# Patient Record
Sex: Male | Born: 1965 | Marital: Single | Smoking: Current every day smoker
Health system: Southern US, Community
[De-identification: ages and names within clinical notes are randomized; demographics above are authoritative.]

---

## 2013-10-07 ENCOUNTER — Emergency Department (INDEPENDENT_AMBULATORY_CARE_PROVIDER_SITE_OTHER)
Admission: EM | Admit: 2013-10-07 | Discharge: 2013-10-07 | Disposition: A | Discharge status: Home / Self Care | Payer: Medicaid Other | Source: Home / Self Care | Attending: Family Medicine | Admitting: Family Medicine

## 2013-10-07 ENCOUNTER — Encounter (HOSPITAL_COMMUNITY): Payer: Self-pay | Admitting: Emergency Medicine

## 2013-10-07 DIAGNOSIS — R030 Elevated blood-pressure reading, without diagnosis of hypertension: Secondary | ICD-10-CM

## 2013-10-07 DIAGNOSIS — IMO0001 Reserved for inherently not codable concepts without codable children: Secondary | ICD-10-CM

## 2013-10-07 LAB — POCT URINALYSIS DIP (DEVICE)
Bilirubin Urine: NEGATIVE
GLUCOSE, UA: NEGATIVE mg/dL
Hgb urine dipstick: NEGATIVE
Ketones, ur: NEGATIVE mg/dL
LEUKOCYTES UA: NEGATIVE
NITRITE: NEGATIVE
PROTEIN: NEGATIVE mg/dL
SPECIFIC GRAVITY, URINE: 1.02 (ref 1.005–1.030)
UROBILINOGEN UA: 0.2 mg/dL (ref 0.0–1.0)
pH: 6 (ref 5.0–8.0)

## 2013-10-07 LAB — POCT I-STAT, CHEM 8
BUN: 8 mg/dL (ref 6–23)
CALCIUM ION: 1.2 mmol/L (ref 1.12–1.23)
Chloride: 103 mEq/L (ref 96–112)
Creatinine, Ser: 1 mg/dL (ref 0.50–1.35)
GLUCOSE: 94 mg/dL (ref 70–99)
HCT: 49 % (ref 39.0–52.0)
HEMOGLOBIN: 16.7 g/dL (ref 13.0–17.0)
Potassium: 3.5 mEq/L — ABNORMAL LOW (ref 3.7–5.3)
Sodium: 143 mEq/L (ref 137–147)
TCO2: 28 mmol/L (ref 0–100)

## 2013-10-07 MED ORDER — AMLODIPINE BESYLATE 2.5 MG PO TABS: 2.5000 mg | ORAL_TABLET | Freq: Every day | ORAL | Status: DC

## 2013-10-07 NOTE — ED Notes (Signed)
C/o blood pressure elevation States he never has been on medication

## 2013-10-07 NOTE — ED Provider Notes (Signed)
CSN: [id number]     Arrival date & time 10/07/13  1329 History   First MD Initiated Contact with Patient 10/07/13 1423     Chief Complaint  Patient presents with  . Hypertension     (Consider location/radiation/quality/duration/timing/severity/associated sxs/prior Treatment) HPI Comments: Patient reports that 4 months ago while still living in [place], he was diagnosed with hypertension, but was not prescribed any treatment. He is a recent immigrant to the [place] and while with his caseworker this morning, he reported this history of HTN and stated that on occasion he feels dizzy and caseworker advised patient should be evaluated at our facility. At the time of exam, patient reports himself to be asymptomatic.  Denies chest pain, syncope, near syncope, dyspnea, pedal edema, palpitations, melena, polyuria, polydipsia or changes in speech, strength, sensation, headaches, or changes in  vision or balance.   Patient is a 48 y.o. male presenting with hypertension. The history is provided by the patient. The history is limited by a language barrier (speaks only Burmese). A language interpreter was used.  Hypertension    History reviewed. No pertinent past medical history. No past surgical history on file. No family history on file. History  Substance Use Topics  . Smoking status: Not on file  . Smokeless tobacco: Not on file  . Alcohol Use: Not on file    Review of Systems  All other systems reviewed and are negative.      Allergies  Review of patient's allergies indicates no known allergies.  Home Medications  No current outpatient prescriptions on file. BP 138/99  Pulse 73  Temp(Src) 98.8 F (37.1 C) (Oral)  Resp 16  SpO2 98% Physical Exam  Nursing note and vitals reviewed. Constitutional: He is oriented to person, place, and time. He appears well-developed and well-nourished. No distress.  HENT:  Head: Normocephalic and atraumatic.  Right Ear: External ear normal.  Left  Ear: External ear normal.  Nose: Nose normal.  Mouth/Throat: Oropharynx is clear and moist.  Eyes: Conjunctivae are normal. Right eye exhibits no discharge. Left eye exhibits no discharge. No scleral icterus.  Neck: Normal range of motion. Neck supple. No JVD present. No thyromegaly present.  Cardiovascular: Normal rate, regular rhythm and normal heart sounds.   Pulmonary/Chest: Effort normal and breath sounds normal.  Abdominal: Soft. Bowel sounds are normal. He exhibits no distension. There is no tenderness.  Musculoskeletal: Normal range of motion. He exhibits no edema and no tenderness.  Lymphadenopathy:    He has no cervical adenopathy.  Neurological: He is alert and oriented to person, place, and time. No cranial nerve deficit.  Skin: Skin is warm and dry. No rash noted. He is not diaphoretic. No erythema.  Psychiatric: He has a normal mood and affect. His behavior is normal.    ED Course  Procedures (including critical care time) Labs Review Labs Reviewed - No data to display Imaging Review No results found.  ECG: NSR @ 63 bpm without acute ST/Twave changes or ectopy  MDM   Final diagnoses:  None   UA unremarkable. I-stat grossly normal. No anemia. Will provide Rx for Norvasc 2.5mg  po QD in limited amount and resource information [facility]. Will advise patient follow up with above clinic.   Jess BartersJennifer Lee South[place], [place] 10/07/13 1550

## 2013-10-08 NOTE — ED Provider Notes (Signed)
Medical screening examination/treatment/procedure(s) were performed by resident physician or non-physician practitioner and as supervising physician I was immediately available for consultation/collaboration.   Jacie Tristan DOUGLAS MD.   Jourdin Gens D Christasia Angeletti, MD 10/08/13 1955 

## 2013-10-29 ENCOUNTER — Ambulatory Visit: Payer: Self-pay

## 2013-10-31 ENCOUNTER — Ambulatory Visit: Payer: Medicaid Other | Attending: Internal Medicine | Admitting: Internal Medicine

## 2013-10-31 ENCOUNTER — Encounter: Payer: Self-pay | Admitting: Internal Medicine

## 2013-10-31 VITALS — BP 146/96 | HR 66 | Temp 98.8°F | Resp 16 | Ht 66.0 in | Wt 142.0 lb

## 2013-10-31 DIAGNOSIS — Z0000 Encounter for general adult medical examination without abnormal findings: Secondary | ICD-10-CM

## 2013-10-31 DIAGNOSIS — T148XXA Other injury of unspecified body region, initial encounter: Secondary | ICD-10-CM

## 2013-10-31 DIAGNOSIS — I10 Essential (primary) hypertension: Secondary | ICD-10-CM

## 2013-10-31 DIAGNOSIS — B351 Tinea unguium: Secondary | ICD-10-CM

## 2013-10-31 LAB — CBC
HCT: 43.6 % (ref 39.0–52.0)
Hemoglobin: 15.3 g/dL (ref 13.0–17.0)
MCH: 32.1 pg (ref 26.0–34.0)
MCHC: 35.1 g/dL (ref 30.0–36.0)
MCV: 91.4 fL (ref 78.0–100.0)
PLATELETS: 203 10*3/uL (ref 150–400)
RBC: 4.77 MIL/uL (ref 4.22–5.81)
RDW: 12.5 % (ref 11.5–15.5)
WBC: 6.8 10*3/uL (ref 4.0–10.5)

## 2013-10-31 LAB — LIPID PANEL
CHOL/HDL RATIO: 3.7 ratio
CHOLESTEROL: 187 mg/dL (ref 0–200)
HDL: 51 mg/dL (ref >39)
LDL Cholesterol: 118 mg/dL — ABNORMAL HIGH (ref 0–99)
TRIGLYCERIDES: 92 mg/dL (ref <150)
VLDL: 18 mg/dL (ref 0–40)

## 2013-10-31 LAB — COMPLETE METABOLIC PANEL WITH GFR
ALT: 51 U/L (ref 0–53)
AST: 34 U/L (ref 0–37)
Albumin: 4.5 g/dL (ref 3.5–5.2)
Alkaline Phosphatase: 104 U/L (ref 39–117)
BILIRUBIN TOTAL: 0.4 mg/dL (ref 0.2–1.2)
BUN: 10 mg/dL (ref 6–23)
CO2: 28 mEq/L (ref 19–32)
CREATININE: 0.91 mg/dL (ref 0.50–1.35)
Calcium: 9.7 mg/dL (ref 8.4–10.5)
Chloride: 102 mEq/L (ref 96–112)
GFR, Est African American: >89 mL/min
GFR, Est Non African American: >89 mL/min
Glucose, Bld: 138 mg/dL — ABNORMAL HIGH (ref 70–99)
Potassium: 3.9 mEq/L (ref 3.5–5.3)
Sodium: 140 mEq/L (ref 135–145)
Total Protein: 7.3 g/dL (ref 6.0–8.3)

## 2013-10-31 MED ORDER — HYDROCHLOROTHIAZIDE 25 MG PO TABS: 25.0000 mg | ORAL_TABLET | Freq: Every day | ORAL | Status: DC

## 2013-10-31 NOTE — Progress Notes (Unsigned)
Patient ID: Tristan Singh, male   DOB: [birth year], 48 y.o.   MRN: [id number]   HPI: Tristan Singh is a 48 y.o. male from [place] presenting on 10/31/2013 with history  of HTN diagnosed 2 wks ago at a doctor's office. He was not given medication at that time. BP is noted to be high here.  He is also here for b/l arm and leg pain starting a week ago. Also, is concerned about onychomycosis of his nails.   NOTE- interpretor was required    History reviewed. No pertinent past medical history.  History reviewed. No pertinent past surgical history.  Current Outpatient Prescriptions  Medication Sig Dispense Refill  . hydrochlorothiazide (HYDRODIURIL) 25 MG tablet Take 1 tablet (25 mg total) by mouth daily.  90 tablet  3   No current facility-administered medications for this visit.    No Known Allergies  History reviewed. No pertinent family history.  History   Social History  . Marital Status: Single    Spouse Name: N/A    Number of Children: N/A  . Years of Education: N/A   Occupational History  . Not on file.   Social History Main Topics  . Smoking status: Current Every Day Smoker- 4 cig/ day for 4 yrs  . Smokeless tobacco: Not on file  . Alcohol Use: No  . Drug Use: No  . Sexual Activity: No   Other Topics Concern  . Not on file   Social History Narrative  . No narrative on file    Review of Systems  Review of Systems  Constitutional: Negative for fever, chills, diaphoresis, activity change, appetite change and fatigue.  HENT: Negative for ear pain, nosebleeds, congestion, facial swelling, rhinorrhea, neck pain, neck stiffness and ear discharge.  Eyes: Negative for pain, discharge, redness, itching and visual disturbance.  Respiratory: + mild dry cough, choking, chest tightness, shortness of breath, wheezing and stridor.  Cardiovascular: Negative for chest pain, palpitations and leg swelling.  Gastrointestinal: Negative for abdominal distention, vomiting, diarrhea or  consitpation Genitourinary: Negative for dysuria, urgency, + for frequency, hematuria, flank pain, decreased urine volume, difficulty urinating and dyspareunia.  Musculoskeletal: Negative for back pain, joint swelling, arthralgias or gait problem. PER HPI  Neurological: Negative for dizziness, tremors, seizures, syncope, facial asymmetry, speech difficulty, weakness, light-headedness, numbness and headaches.  Hematological: Negative for adenopathy. Does not bruise/bleed easily.  Psychiatric/Behavioral: Negative for hallucinations, behavioral problems, confusion, dysphoric mood   Objective:  BP 146/96  Pulse 66  Temp(Src) 98.8 F (37.1 C) (Oral)  Resp 16  Ht 5\' 6"  (1.676 m)  Wt 142 lb (64.411 kg)  BMI 22.93 kg/m2  SpO2 98% Filed Weights   10/31/13 0910  Weight: 142 lb (64.411 kg)     Physical Exam  Constitutional: Appears well-developed and well-nourished. No distress. HENT: Normocephalic. External right and left ear normal. Oropharynx is clear and moist.  Eyes: Conjunctivae and EOM are normal. PERRLA, no scleral icterus.  Neck: Normal ROM. Neck supple. No JVD. No tracheal deviation. No thyromegaly.  CVS: RRR, S1/S2 +, no murmurs, no gallops, no carotid bruit.  Pulmonary: Effort and breath sounds normal, no stridor, rhonchi, wheezes, rales.  Abdominal: Soft. BS +,  no distension, tenderness, rebound or guarding.  Musculoskeletal: Normal range of motion. No edema - tender in right triceps- pain increased with extension of arm again resistance, tender in left medial epicondyle. No swelling noted. Legs and knees not tender or swollen. Neuro: Alert. Normal reflexes, muscle tone coordination. No cranial nerve deficit. Skin:  Skin is warm and dry. No rash noted. Not diaphoretic. No erythema. No pallor.  Psychiatric: Normal mood and affect. Behavior, judgment, thought content normal.   No results found for this basename: WBC,  HGB,  HCT,  MCV,  PLT   No results found for this basename:  CREATININE,  BUN,  NA,  K,  CL,  CO2    No results found for this basename: HGBA1C   Lipid Panel  No results found for this basename: chol,  trig,  hdl,  cholhdl,  vldl,  ldlcalc        Patient Active Problem List   Diagnosis Date Noted  . HTN (hypertension) 10/31/2013     Preventative Medicine:  No health maintenance topics applied.  No health maintenance topics applied.   LAB WORK: ordered Metabolic panel: CBC:  Vitamin D : Lipid Panel: TSH: PSA:    Assessment and plan:  HTN - start HCTZ  Routine history and physical examination of adult - Plan: CBC, COMPLETE METABOLIC PANEL WITH GFR, TSH, Lipid panel, Vit D  25 hydroxy (rtn osteoporosis monitoring), PSA  Sprains and strains of joints and adjacent muscles - Alleve, Aspercreme, RICE   Nail fungus - f/u LFTs and prescribe medication if normal     Return in about 1 month (around 12/01/2013).   The patient was given clear instructions to go to ER or return to medical center if symptoms don't improve, worsen or new problems develop. The patient verbalized understanding. The patient was told to call to get lab results if they haven't heard anything in the next week.     Calvert CantorSaima Skylyn Slezak, MD

## 2013-10-31 NOTE — Patient Instructions (Addendum)
You can take Alleve 1 to 4 tablets twice a day for your pain.  If it causes heart burn, you can add Tums and Pepcid which is twice a day.  Do not do any activity that exacerbates the pain.  Use Aspercreme 10% for muscle/ joint   RICE: Routine Care for Injuries The routine care of many injuries includes Rest, Ice, Compression, and Elevation (RICE). HOME CARE INSTRUCTIONS  Rest is needed to allow your body to heal. Routine activities can usually be resumed when comfortable. Injured tendons and bones can take up to 6 weeks to heal. Tendons are the cord-like structures that attach muscle to bone.  Ice following an injury helps keep the swelling down and reduces pain.  Put ice in a plastic bag.  Place a towel between your skin and the bag.  Leave the ice on for 15-20 minutes, 03-04 times a day. Do this while awake, for the first 24 to 48 hours. After that, continue as directed by your caregiver.  Compression helps keep swelling down. It also gives support and helps with discomfort. If an elastic bandage has been applied, it should be removed and reapplied every 3 to 4 hours. It should not be applied tightly, but firmly enough to keep swelling down. Watch fingers or toes for swelling, bluish discoloration, coldness, numbness, or excessive pain. If any of these problems occur, remove the bandage and reapply loosely. Contact your caregiver if these problems continue.  Elevation helps reduce swelling and decreases pain. With extremities, such as the arms, hands, legs, and feet, the injured area should be placed near or above the level of the heart, if possible. SEEK IMMEDIATE MEDICAL CARE IF:  You have persistent pain and swelling.  You develop redness, numbness, or unexpected weakness.  Your symptoms are getting worse rather than improving after several days. These symptoms may indicate that further evaluation or further X-rays are needed. Sometimes, X-rays may not show a small broken bone  (fracture) until 1 week or 10 days later. Make a follow-up appointment with your caregiver. Ask when your X-ray results will be ready. Make sure you get your X-ray results. Document Released: 11/26/2000 Document Revised: 11/06/2011 Document Reviewed: 01/13/2011 [facility] Patient Information 2014 [employer] [place].  pains

## 2013-10-31 NOTE — Progress Notes (Unsigned)
Pt is here to establish care. Pt reports having pain in his legs and arms for 5 months. Pt states that he has polyuria. Pt has fungus on his fingernails.

## 2013-11-01 LAB — VITAMIN D 25 HYDROXY (VIT D DEFICIENCY, FRACTURES): Vit D, 25-Hydroxy: 24 ng/mL — ABNORMAL LOW (ref 30–89)

## 2013-11-01 LAB — TSH: TSH: 0.445 u[IU]/mL (ref 0.350–4.500)

## 2013-11-01 LAB — PSA: PSA: 0.55 ng/mL (ref <=4.00)

## 2013-11-01 NOTE — Progress Notes (Signed)
Quick Note:  Please let patient know that they are deficient in Vit D and prescribe 50,000 U Vit D weekly for 4 wks x 4 refills.  If they are unable to afford it, they can take Vit D2 OTC 2000 U daily. ______ 

## 2013-12-05 ENCOUNTER — Encounter: Payer: Self-pay | Admitting: Internal Medicine

## 2013-12-05 ENCOUNTER — Ambulatory Visit: Payer: Medicaid Other | Attending: Internal Medicine | Admitting: Internal Medicine

## 2013-12-05 VITALS — BP 127/82 | HR 67 | Temp 98.6°F | Resp 16 | Ht 66.0 in | Wt 131.0 lb

## 2013-12-05 DIAGNOSIS — M542 Cervicalgia: Secondary | ICD-10-CM | POA: Insufficient documentation

## 2013-12-05 DIAGNOSIS — B351 Tinea unguium: Secondary | ICD-10-CM | POA: Insufficient documentation

## 2013-12-05 DIAGNOSIS — F17200 Nicotine dependence, unspecified, uncomplicated: Secondary | ICD-10-CM | POA: Insufficient documentation

## 2013-12-05 DIAGNOSIS — M25529 Pain in unspecified elbow: Secondary | ICD-10-CM | POA: Insufficient documentation

## 2013-12-05 DIAGNOSIS — M25522 Pain in left elbow: Secondary | ICD-10-CM

## 2013-12-05 DIAGNOSIS — R7301 Impaired fasting glucose: Secondary | ICD-10-CM | POA: Insufficient documentation

## 2013-12-05 DIAGNOSIS — I10 Essential (primary) hypertension: Secondary | ICD-10-CM | POA: Insufficient documentation

## 2013-12-05 LAB — GLUCOSE, POCT (MANUAL RESULT ENTRY): POC GLUCOSE: 82 mg/dL (ref 70–99)

## 2013-12-05 LAB — POCT GLYCOSYLATED HEMOGLOBIN (HGB A1C): Hemoglobin A1C: 5.4

## 2013-12-05 MED ORDER — IBUPROFEN 600 MG PO TABS: 600.0000 mg | ORAL_TABLET | Freq: Three times a day (TID) | ORAL | Status: DC | PRN

## 2013-12-05 MED ORDER — TERBINAFINE HCL 250 MG PO TABS: 250.0000 mg | ORAL_TABLET | Freq: Every day | ORAL | Status: DC

## 2013-12-05 NOTE — Progress Notes (Signed)
MRN: [id number] Name: Tristan Singh  Sex: male Age: 48 y.o. DOB: 1965/09/17  Allergies: Review of patient's allergies indicates no known allergies.  Chief Complaint  Patient presents with  . Follow-up    HPI: Patient is 48 y.o. male who has history of hypertension, comes today for followup, blood work reviewed with the patient noticed impaired fasting glucose, patient also reported to have pain in his left elbow as well as neck pain for several days, denies any numbness weakness, patient also has onychomycosis and has never been treated.  History reviewed. No pertinent past medical history.  History reviewed. No pertinent past surgical history.    Medication List       This list is accurate as of: 12/05/13  4:49 PM.  Always use your most recent med list.               amLODipine 2.5 MG tablet  Commonly known as:  NORVASC  Take 1 tablet (2.5 mg total) by mouth daily.     hydrochlorothiazide 25 MG tablet  Commonly known as:  HYDRODIURIL  Take 1 tablet (25 mg total) by mouth daily.     ibuprofen 600 MG tablet  Commonly known as:  ADVIL,MOTRIN  Take 1 tablet (600 mg total) by mouth every 8 (eight) hours as needed.     terbinafine 250 MG tablet  Commonly known as:  LAMISIL  Take 1 tablet (250 mg total) by mouth daily.        Meds ordered this encounter  Medications  . ibuprofen (ADVIL,MOTRIN) 600 MG tablet    Sig: Take 1 tablet (600 mg total) by mouth every 8 (eight) hours as needed.    Dispense:  30 tablet    Refill:  1  . terbinafine (LAMISIL) 250 MG tablet    Sig: Take 1 tablet (250 mg total) by mouth daily.    Dispense:  30 tablet    Refill:  1     There is no immunization history on file for this patient.  History reviewed. No pertinent family history.  History  Substance Use Topics  . Smoking status: Current Every Day Smoker  . Smokeless tobacco: Not on file  . Alcohol Use: No    Review of Systems   As noted in HPI  Filed Vitals:   12/05/13 1606  BP: 127/82  Pulse: 67  Temp: 98.6 F (37 C)  Resp: 16    Physical Exam  Physical Exam  Constitutional: No distress.  Eyes: EOM are normal. Pupils are equal, round, and reactive to light.  Cardiovascular: Normal rate and regular rhythm.   Pulmonary/Chest: Breath sounds normal. No respiratory distress. He has no wheezes. He has no rales.  Musculoskeletal:  With flexion and extension in the neck patient does complain of discomfort. Her  Tenderness on the medial epicondylar on the left elbow, good range of motion. DTR 2+    CBC    Component Value Date/Time   WBC 6.8 10/31/2013 0946   RBC 4.77 10/31/2013 0946   HGB 15.3 10/31/2013 0946   HCT 43.6 10/31/2013 0946   PLT 203 10/31/2013 0946   MCV 91.4 10/31/2013 0946    CMP     Component Value Date/Time   NA 140 10/31/2013 0946   K 3.9 10/31/2013 0946   CL 102 10/31/2013 0946   CO2 28 10/31/2013 0946   GLUCOSE 138* 10/31/2013 0946   BUN 10 10/31/2013 0946   CREATININE 0.91 10/31/2013 0946   CREATININE 1.00  10/07/2013 1537   CALCIUM 9.7 10/31/2013 0946   PROT 7.3 10/31/2013 0946   ALBUMIN 4.5 10/31/2013 0946   AST 34 10/31/2013 0946   ALT 51 10/31/2013 0946   ALKPHOS 104 10/31/2013 0946   BILITOT 0.4 10/31/2013 0946   GFRNONAA >89 10/31/2013 0946   GFRAA >89 10/31/2013 0946    Lab Results  Component Value Date/Time   CHOL 187 10/31/2013  9:46 AM    No components found with this basename: hga1c    Lab Results  Component Value Date/Time   AST 34 10/31/2013  9:46 AM    Assessment and Plan  HTN (hypertension) Blood pressure is improved continue with hydrochlorothiazide 25 mg daily.  IFG (impaired fasting glucose) - Plan: Glucose (CBG), HgB A1c Advised patient for low carbohydrate diet  Smoking Advised patient to quit smoking  Neck pain - Plan: DG Cervical Spine Complete, ibuprofen (ADVIL,MOTRIN) 600 MG tablet  Left elbow pain - Plan: DG Elbow Complete Left, ibuprofen (ADVIL,MOTRIN) 600 MG tablet  Nail fungus - Plan: Started patient  on terbinafine (LAMISIL) 250 MG tablet, will repeat her Hepatic Function Panel in 6 weeks.  Return in about 3 months (around 03/06/2014) for hypertension.  Doris Cheadleeepak Laketa Sandoz, MD

## 2013-12-05 NOTE — Patient Instructions (Signed)
DASH Diet  The DASH diet stands for "Dietary Approaches to Stop Hypertension." It is a healthy eating plan that has been shown to reduce high blood pressure (hypertension) in as little as 14 days, while also possibly providing other significant health benefits. These other health benefits include reducing the risk of breast cancer after menopause and reducing the risk of type 2 diabetes, heart disease, colon cancer, and stroke. Health benefits also include weight loss and slowing kidney failure in patients with chronic kidney disease.   DIET GUIDELINES  · Limit salt (sodium). Your diet should contain less than 1500 mg of sodium daily.  · Limit refined or processed carbohydrates. Your diet should include mostly whole grains. Desserts and added sugars should be used sparingly.  · Include small amounts of heart-healthy fats. These types of fats include nuts, oils, and tub margarine. Limit saturated and trans fats. These fats have been shown to be harmful in the body.  CHOOSING FOODS   The following food groups are based on a 2000 calorie diet. See your Registered Dietitian for individual calorie needs.  Grains and Grain Products (6 to 8 servings daily)  · Eat More Often: Whole-wheat bread, brown rice, whole-grain or wheat pasta, quinoa, popcorn without added fat or salt (air popped).  · Eat Less Often: White bread, white pasta, white rice, cornbread.  Vegetables (4 to 5 servings daily)  · Eat More Often: Fresh, frozen, and canned vegetables. Vegetables may be raw, steamed, roasted, or grilled with a minimal amount of fat.  · Eat Less Often/Avoid: Creamed or fried vegetables. Vegetables in a cheese sauce.  Fruit (4 to 5 servings daily)  · Eat More Often: All fresh, canned (in natural juice), or frozen fruits. Dried fruits without added sugar. One hundred percent fruit juice (½ cup [237 mL] daily).  · Eat Less Often: Dried fruits with added sugar. Canned fruit in light or heavy syrup.  Lean Meats, Fish, and Poultry (2  servings or less daily. One serving is 3 to 4 oz [85-114 g]).  · Eat More Often: Ninety percent or leaner ground beef, tenderloin, sirloin. Round cuts of beef, chicken breast, turkey breast. All fish. Grill, bake, or broil your meat. Nothing should be fried.  · Eat Less Often/Avoid: Fatty cuts of meat, turkey, or chicken leg, thigh, or wing. Fried cuts of meat or fish.  Dairy (2 to 3 servings)  · Eat More Often: Low-fat or fat-free milk, low-fat plain or light yogurt, reduced-fat or part-skim cheese.  · Eat Less Often/Avoid: Milk (whole, 2%). Whole milk yogurt. Full-fat cheeses.  Nuts, Seeds, and Legumes (4 to 5 servings per week)  · Eat More Often: All without added salt.  · Eat Less Often/Avoid: Salted nuts and seeds, canned beans with added salt.  Fats and Sweets (limited)  · Eat More Often: Vegetable oils, tub margarines without trans fats, sugar-free gelatin. Mayonnaise and salad dressings.  · Eat Less Often/Avoid: Coconut oils, palm oils, butter, stick margarine, cream, half and half, cookies, candy, pie.  FOR MORE INFORMATION  The Dash Diet Eating Plan: www.dashdiet.org  Document Released: 08/03/2011 Document Revised: 11/06/2011 Document Reviewed: 08/03/2011  ExitCare® Patient Information ©2014 [employer].

## 2013-12-05 NOTE — Progress Notes (Signed)
Pt is here following up on his chronic neck pain. Pt needed the interpretor line.

## 2013-12-08 ENCOUNTER — Ambulatory Visit (HOSPITAL_COMMUNITY)
Admission: RE | Admit: 2013-12-08 | Discharge: 2013-12-08 | Disposition: A | Discharge status: Home / Self Care | Payer: Medicaid Other | Source: Ambulatory Visit | Attending: Internal Medicine | Admitting: Internal Medicine

## 2013-12-08 DIAGNOSIS — M47812 Spondylosis without myelopathy or radiculopathy, cervical region: Secondary | ICD-10-CM | POA: Insufficient documentation

## 2013-12-08 DIAGNOSIS — M542 Cervicalgia: Secondary | ICD-10-CM | POA: Insufficient documentation

## 2013-12-08 DIAGNOSIS — M25529 Pain in unspecified elbow: Secondary | ICD-10-CM | POA: Insufficient documentation

## 2013-12-08 DIAGNOSIS — M25522 Pain in left elbow: Secondary | ICD-10-CM

## 2014-01-05 ENCOUNTER — Encounter: Payer: Self-pay | Admitting: Internal Medicine

## 2014-01-05 ENCOUNTER — Ambulatory Visit: Payer: Medicaid Other | Attending: Internal Medicine | Admitting: Internal Medicine

## 2014-01-05 VITALS — BP 128/82 | HR 70 | Temp 97.8°F | Resp 16 | Wt 130.2 lb

## 2014-01-05 DIAGNOSIS — B351 Tinea unguium: Secondary | ICD-10-CM

## 2014-01-05 DIAGNOSIS — L609 Nail disorder, unspecified: Secondary | ICD-10-CM | POA: Insufficient documentation

## 2014-01-05 DIAGNOSIS — F17200 Nicotine dependence, unspecified, uncomplicated: Secondary | ICD-10-CM | POA: Insufficient documentation

## 2014-01-05 DIAGNOSIS — R7301 Impaired fasting glucose: Secondary | ICD-10-CM | POA: Insufficient documentation

## 2014-01-05 DIAGNOSIS — I10 Essential (primary) hypertension: Secondary | ICD-10-CM | POA: Insufficient documentation

## 2014-01-05 MED ORDER — HYDROCHLOROTHIAZIDE 25 MG PO TABS
25.0000 mg | ORAL_TABLET | Freq: Every day | ORAL | Status: AC
Start: 2014-01-05 — End: ?

## 2014-01-05 MED ORDER — IBUPROFEN 600 MG PO TABS: 600.0000 mg | ORAL_TABLET | Freq: Three times a day (TID) | ORAL | Status: AC | PRN

## 2014-01-05 MED ORDER — AMLODIPINE BESYLATE 2.5 MG PO TABS: 2.5000 mg | ORAL_TABLET | Freq: Every day | ORAL | Status: AC

## 2014-01-05 MED ORDER — TERBINAFINE HCL 250 MG PO TABS: 250.0000 mg | ORAL_TABLET | Freq: Every day | ORAL | Status: AC

## 2014-01-05 NOTE — Progress Notes (Signed)
MRN: [id number] Name: Tristan Singh  Sex: male Age: 48 y.o. DOB: Sep 22, 1965  Allergies: Review of patient's allergies indicates no known allergies.  Chief Complaint  Patient presents with  . Medication Refill    HPI: Patient is 48 y.o. male who has to of hypertension, impaired fasting glucose, comes today for followup her, requesting refill on his medications also he is taking Lamisil for her nail fungus, denies any acute symptoms. Patient also takes ibuprofen when necessary for joint pain. Patient is to smoke cigarettes, I have advised patient to quit smoking.  History reviewed. No pertinent past medical history.  History reviewed. No pertinent past surgical history.    Medication List       This list is accurate as of: 01/05/14  2:49 PM.  Always use your most recent med list.               amLODipine 2.5 MG tablet  Commonly known as:  NORVASC  Take 1 tablet (2.5 mg total) by mouth daily.     hydrochlorothiazide 25 MG tablet  Commonly known as:  HYDRODIURIL  Take 1 tablet (25 mg total) by mouth daily.     ibuprofen 600 MG tablet  Commonly known as:  ADVIL,MOTRIN  Take 1 tablet (600 mg total) by mouth every 8 (eight) hours as needed.     terbinafine 250 MG tablet  Commonly known as:  LAMISIL  Take 1 tablet (250 mg total) by mouth daily.        Meds ordered this encounter  Medications  . amLODipine (NORVASC) 2.5 MG tablet    Sig: Take 1 tablet (2.5 mg total) by mouth daily.    Dispense:  30 tablet    Refill:  6  . hydrochlorothiazide (HYDRODIURIL) 25 MG tablet    Sig: Take 1 tablet (25 mg total) by mouth daily.    Dispense:  90 tablet    Refill:  3  . ibuprofen (ADVIL,MOTRIN) 600 MG tablet    Sig: Take 1 tablet (600 mg total) by mouth every 8 (eight) hours as needed.    Dispense:  30 tablet    Refill:  1  . terbinafine (LAMISIL) 250 MG tablet    Sig: Take 1 tablet (250 mg total) by mouth daily.    Dispense:  30 tablet    Refill:  1     There is  no immunization history on file for this patient.  History reviewed. No pertinent family history.  History  Substance Use Topics  . Smoking status: Current Every Day Smoker  . Smokeless tobacco: Not on file  . Alcohol Use: No    Review of Systems   As noted in HPI  Filed Vitals:   01/05/14 1423  BP: 128/82  Pulse: 70  Temp: 97.8 F (36.6 C)  Resp: 16    Physical Exam  Physical Exam  Eyes: EOM are normal. Pupils are equal, round, and reactive to light.  Cardiovascular: Normal rate and regular rhythm.   Pulmonary/Chest: Breath sounds normal. No respiratory distress. He has no wheezes. He has no rales.  Skin:  Onychomycosis finger nails    CBC    Component Value Date/Time   WBC 6.8 10/31/2013 0946   RBC 4.77 10/31/2013 0946   HGB 15.3 10/31/2013 0946   HCT 43.6 10/31/2013 0946   PLT 203 10/31/2013 0946   MCV 91.4 10/31/2013 0946    CMP     Component Value Date/Time   NA 140 10/31/2013 0946  K 3.9 10/31/2013 0946   CL 102 10/31/2013 0946   CO2 28 10/31/2013 0946   GLUCOSE 138* 10/31/2013 0946   BUN 10 10/31/2013 0946   CREATININE 0.91 10/31/2013 0946   CREATININE 1.00 10/07/2013 1537   CALCIUM 9.7 10/31/2013 0946   PROT 7.3 10/31/2013 0946   ALBUMIN 4.5 10/31/2013 0946   AST 34 10/31/2013 0946   ALT 51 10/31/2013 0946   ALKPHOS 104 10/31/2013 0946   BILITOT 0.4 10/31/2013 0946   GFRNONAA >89 10/31/2013 0946   GFRAA >89 10/31/2013 0946    Lab Results  Component Value Date/Time   CHOL 187 10/31/2013  9:46 AM    No components found with this basename: hga1c    Lab Results  Component Value Date/Time   AST 34 10/31/2013  9:46 AM    Assessment and Plan  Nail fungus - Plan:continue with  terbinafine (LAMISIL) 250 MG tablet, will repeat LFTs  HTN (hypertension) - Plan: amLODipine (NORVASC) 2.5 MG tablet, hydrochlorothiazide (HYDRODIURIL) 25 MG tablet, COMPLETE METABOLIC PANEL WITH GFR  IFG (impaired fasting glucose) - Plan: Advised for low carbohydrate diet COMPLETE METABOLIC PANEL WITH  GFR  Smoking  As patient could smoking.   Return in about 3 months (around 04/07/2014) for hypertension, IFG.  Doris Cheadleeepak Linde Wilensky, MD

## 2014-01-05 NOTE — Progress Notes (Signed)
Here with an interpreter States needs medications refilled Has blood pressure medications prescribed to him but stated He does not take them

## 2014-01-16 ENCOUNTER — Ambulatory Visit: Payer: Medicaid Other | Attending: Internal Medicine

## 2014-01-16 DIAGNOSIS — B351 Tinea unguium: Secondary | ICD-10-CM

## 2014-01-16 DIAGNOSIS — R7301 Impaired fasting glucose: Secondary | ICD-10-CM

## 2014-01-16 DIAGNOSIS — I10 Essential (primary) hypertension: Secondary | ICD-10-CM

## 2014-01-17 LAB — COMPLETE METABOLIC PANEL WITH GFR
ALT: 24 U/L (ref 0–53)
AST: 26 U/L (ref 0–37)
Albumin: 4.2 g/dL (ref 3.5–5.2)
Alkaline Phosphatase: 82 U/L (ref 39–117)
BILIRUBIN TOTAL: 0.5 mg/dL (ref 0.2–1.2)
BUN: 11 mg/dL (ref 6–23)
CO2: 29 mEq/L (ref 19–32)
Calcium: 9.4 mg/dL (ref 8.4–10.5)
Chloride: 101 mEq/L (ref 96–112)
Creat: 0.79 mg/dL (ref 0.50–1.35)
GFR, Est Non African American: >89 mL/min
Glucose, Bld: 143 mg/dL — ABNORMAL HIGH (ref 70–99)
Potassium: 3.6 mEq/L (ref 3.5–5.3)
SODIUM: 139 meq/L (ref 135–145)
Total Protein: 7.5 g/dL (ref 6.0–8.3)

## 2014-01-17 LAB — HEPATIC FUNCTION PANEL
ALBUMIN: 4.2 g/dL (ref 3.5–5.2)
ALT: 24 U/L (ref 0–53)
AST: 26 U/L (ref 0–37)
Alkaline Phosphatase: 82 U/L (ref 39–117)
Bilirubin, Direct: 0.1 mg/dL (ref 0.0–0.3)
Indirect Bilirubin: 0.4 mg/dL (ref 0.2–1.2)
Total Bilirubin: 0.5 mg/dL (ref 0.2–1.2)
Total Protein: 7.5 g/dL (ref 6.0–8.3)

## 2014-01-20 ENCOUNTER — Telehealth: Payer: Self-pay | Admitting: *Deleted

## 2014-01-20 NOTE — Telephone Encounter (Signed)
Message copied by Raynelle Chary on Tue Jan 20, 2014  5:13 PM ------      Message from: Doris Cheadle      Created: Tue Jan 20, 2014 10:50 AM       Blood work reviewed noticed impaired fasting glucose, call and advise patient for low carbohydrate diet.       ------

## 2014-01-20 NOTE — Telephone Encounter (Signed)
Pt is aware of his lab results. 

## 2014-02-03 ENCOUNTER — Emergency Department (HOSPITAL_COMMUNITY)
Admission: EM | Admit: 2014-02-03 | Discharge: 2014-02-03 | Disposition: A | Discharge status: Home / Self Care | Payer: Medicaid Other | Source: Home / Self Care | Attending: Family Medicine | Admitting: Family Medicine

## 2014-02-03 ENCOUNTER — Emergency Department (INDEPENDENT_AMBULATORY_CARE_PROVIDER_SITE_OTHER): Payer: Medicaid Other

## 2014-02-03 ENCOUNTER — Encounter (HOSPITAL_COMMUNITY): Payer: Self-pay | Admitting: Emergency Medicine

## 2014-02-03 DIAGNOSIS — M25519 Pain in unspecified shoulder: Secondary | ICD-10-CM

## 2014-02-03 MED ORDER — PREDNISONE 5 MG PO KIT: PACK | ORAL | Status: AC

## 2014-02-03 MED ORDER — TRAMADOL HCL 50 MG PO TABS: 50.0000 mg | ORAL_TABLET | Freq: Four times a day (QID) | ORAL | Status: AC | PRN

## 2014-02-03 NOTE — ED Notes (Signed)
Shoulder pain; MD eval only

## 2014-02-03 NOTE — Discharge Instructions (Signed)
Thank you for coming in today.  Shoulder Pain The shoulder is the joint that connects your arms to your body. The bones that form the shoulder joint include the upper arm bone (humerus), the shoulder blade (scapula), and the collarbone (clavicle). The top of the humerus is shaped like a ball and fits into a rather flat socket on the scapula (glenoid cavity). A combination of muscles and strong, fibrous tissues that connect muscles to bones (tendons) support your shoulder joint and hold the ball in the socket. Small, fluid-filled sacs (bursae) are located in different areas of the joint. They act as cushions between the bones and the overlying soft tissues and help reduce friction between the gliding tendons and the bone as you move your arm. Your shoulder joint allows a wide range of motion in your arm. This range of motion allows you to do things like scratch your back or throw a ball. However, this range of motion also makes your shoulder more prone to pain from overuse and injury. Causes of shoulder pain can originate from both injury and overuse and usually can be grouped in the following four categories:  Redness, swelling, and pain (inflammation) of the tendon (tendinitis) or the bursae (bursitis).  Instability, such as a dislocation of the joint.  Inflammation of the joint (arthritis).  Broken bone (fracture). HOME CARE INSTRUCTIONS   Apply ice to the sore area.  Put ice in a plastic bag.  Place a towel between your skin and the bag.  Leave the ice on for 15-20 minutes, 03-04 times per day for the first 2 days.  Stop using cold packs if they do not help with the pain.  If you have a shoulder sling or immobilizer, wear it as long as your caregiver instructs. Only remove it to shower or bathe. Move your arm as little as possible, but keep your hand moving to prevent swelling.  Squeeze a soft ball or foam pad as much as possible to help prevent swelling.  Only take over-the-counter  or prescription medicines for pain, discomfort, or fever as directed by your caregiver. SEEK MEDICAL CARE IF:   Your shoulder pain increases, or new pain develops in your arm, hand, or fingers.  Your hand or fingers become cold and numb.  Your pain is not relieved with medicines. SEEK IMMEDIATE MEDICAL CARE IF:   Your arm, hand, or fingers are numb or tingling.  Your arm, hand, or fingers are significantly swollen or turn white or blue. MAKE SURE YOU:   Understand these instructions.  Will watch your condition.  Will get help right away if you are not doing well or get worse. Document Released: 05/24/2005 Document Revised: 05/08/2012 Document Reviewed: 07/29/2011 [facility] Patient Information 2014 [place], [place].

## 2014-02-03 NOTE — ED Provider Notes (Signed)
Thadius Macauley Mossberg is a 48 y.o. male who presents to Urgent Care today for right shoulder pain present for one month without injury. Patient has pain in the right trapezius right upper arm. The pain is worse with overhead motion, reaching back, and at night. He has tried some over-the-counter pain medications for pain which helps. He denies any radiating pain weakness or numbness. No fevers or chills nausea vomiting or diarrhea.   No past medical history on file. History  Substance Use Topics  . Smoking status: Current Every Day Smoker  . Smokeless tobacco: Not on file  . Alcohol Use: No   ROS as above Medications: No current facility-administered medications for this encounter.   Current Outpatient Prescriptions  Medication Sig Dispense Refill  . amLODipine (NORVASC) 2.5 MG tablet Take 1 tablet (2.5 mg total) by mouth daily.  30 tablet  6  . hydrochlorothiazide (HYDRODIURIL) 25 MG tablet Take 1 tablet (25 mg total) by mouth daily.  90 tablet  3  . ibuprofen (ADVIL,MOTRIN) 600 MG tablet Take 1 tablet (600 mg total) by mouth every 8 (eight) hours as needed.  30 tablet  1  . PredniSONE 5 MG KIT 12 day dosepack po  1 kit  0  . terbinafine (LAMISIL) 250 MG tablet Take 1 tablet (250 mg total) by mouth daily.  30 tablet  1  . traMADol (ULTRAM) 50 MG tablet Take 1 tablet (50 mg total) by mouth every 6 (six) hours as needed.  15 tablet  0    Exam:  BP 162/92  Pulse 65  Temp(Src) 97.4 F (36.3 C) (Oral)  Resp 17  SpO2 97% Gen: Well NAD Neck: Nontender spinal midline. Normal neck range of motion. Right shoulder: Nontender Normal abduction range of motion with pain at the last 20 of the arc. External rotation to 90 beyond neutral position with pain. Normal internal rotation. Mildly positive impingement testing. Strength is intact.  Upper extremity strength is equal bilaterally. Sensation is intact throughout.  No results found for this or any previous visit (from the past 24 hour(s)). Dg  Shoulder Right  02/03/2014   CLINICAL DATA:  Fall, right shoulder pain  EXAM: RIGHT SHOULDER - 2+ VIEW  COMPARISON:  None.  FINDINGS: Three views of the right shoulder submitted. No acute fracture or subluxation. Moderate degenerative changes right AC joint. Glenohumeral joint is preserved.  IMPRESSION: No acute fracture or subluxation. Degenerative changes right AC joint.   Electronically Signed   By: Lahoma Crocker M.D.   On: 02/03/2014 14:45    Assessment and Plan: 48 y.o. male with Right shoulder pain. Possible rotator cuff tendinitis versus a.c. joint DJD. Plan to treat with low-dose prednisone dose pack and tramadol. Followup with orthopedics if not improving.  Discussed warning signs or symptoms. Please see discharge instructions. Patient expresses understanding.    Gregor Hams, MD 02/03/14 [phone]

## 2014-03-06 ENCOUNTER — Ambulatory Visit: Payer: Medicaid Other | Admitting: Internal Medicine

## 2014-04-07 ENCOUNTER — Ambulatory Visit: Payer: Medicaid Other | Admitting: Internal Medicine

## 2016-01-10 IMAGING — CR DG CERVICAL SPINE COMPLETE 4+V
7 series · 7 of 7 positions shown · non-contrast
Comparison: None.

CLINICAL DATA: Posterior neck pain

EXAM:
CERVICAL SPINE  4+ VIEWS

[w cervical spine lat]
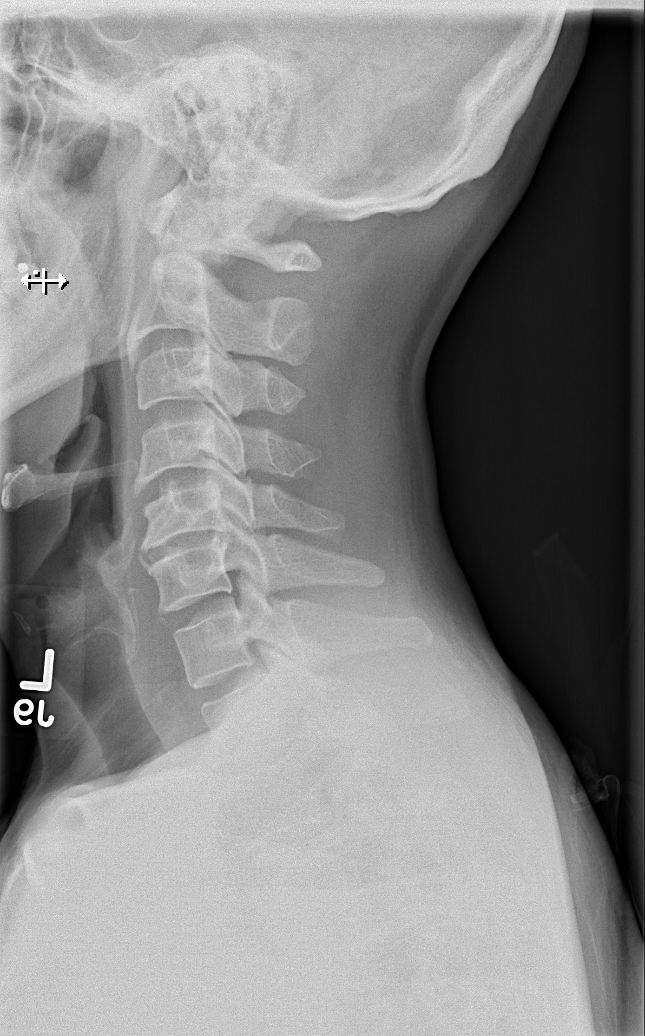

[w cervical spine ap_obl (1 of 2)]
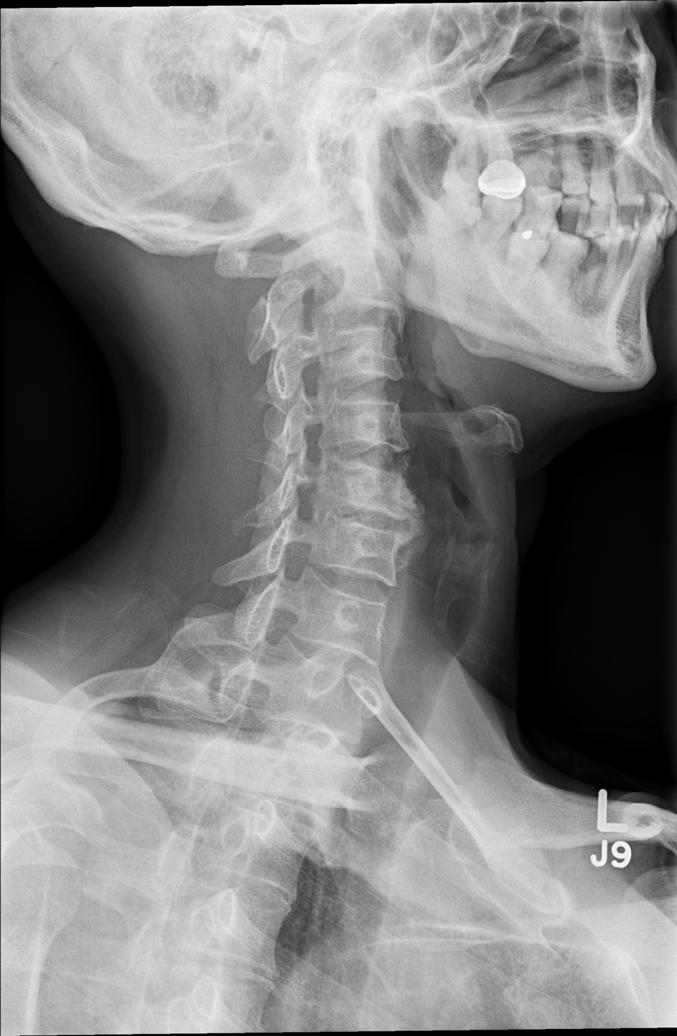

[w cervical spine ap_obl (2 of 2)]
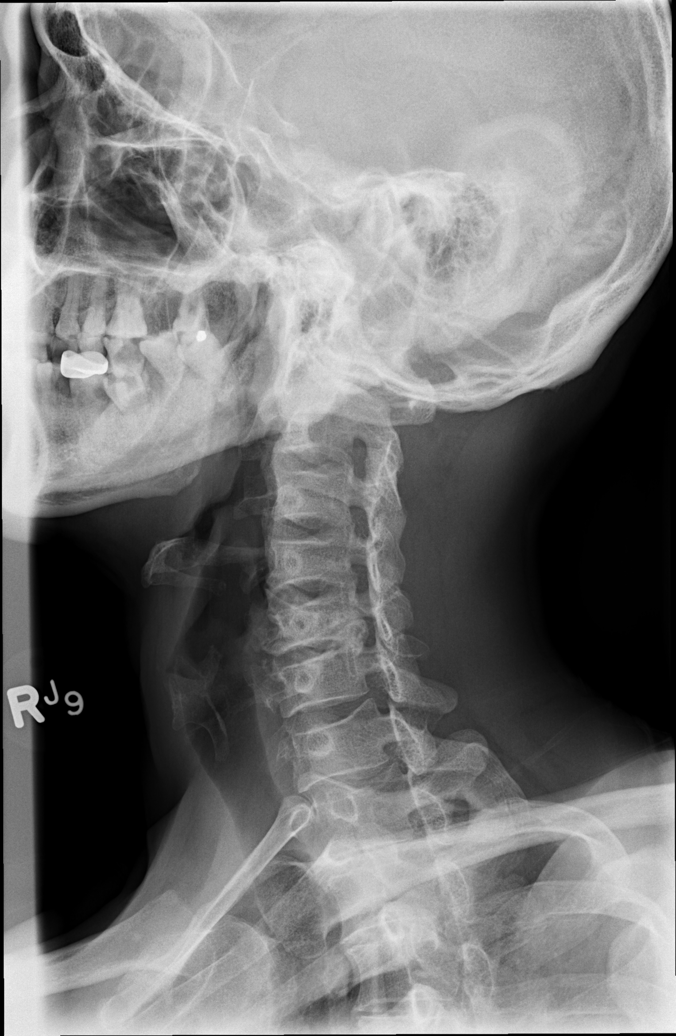

[w cervical spine ap]
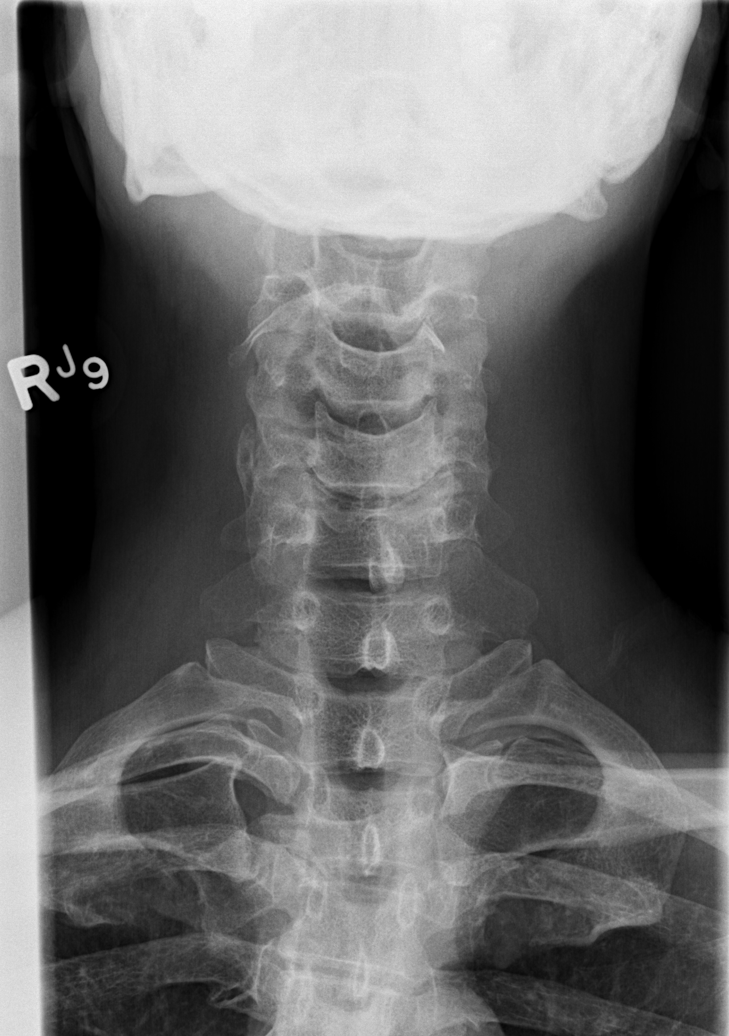

[w cervical spine odontoid (1 of 2)]
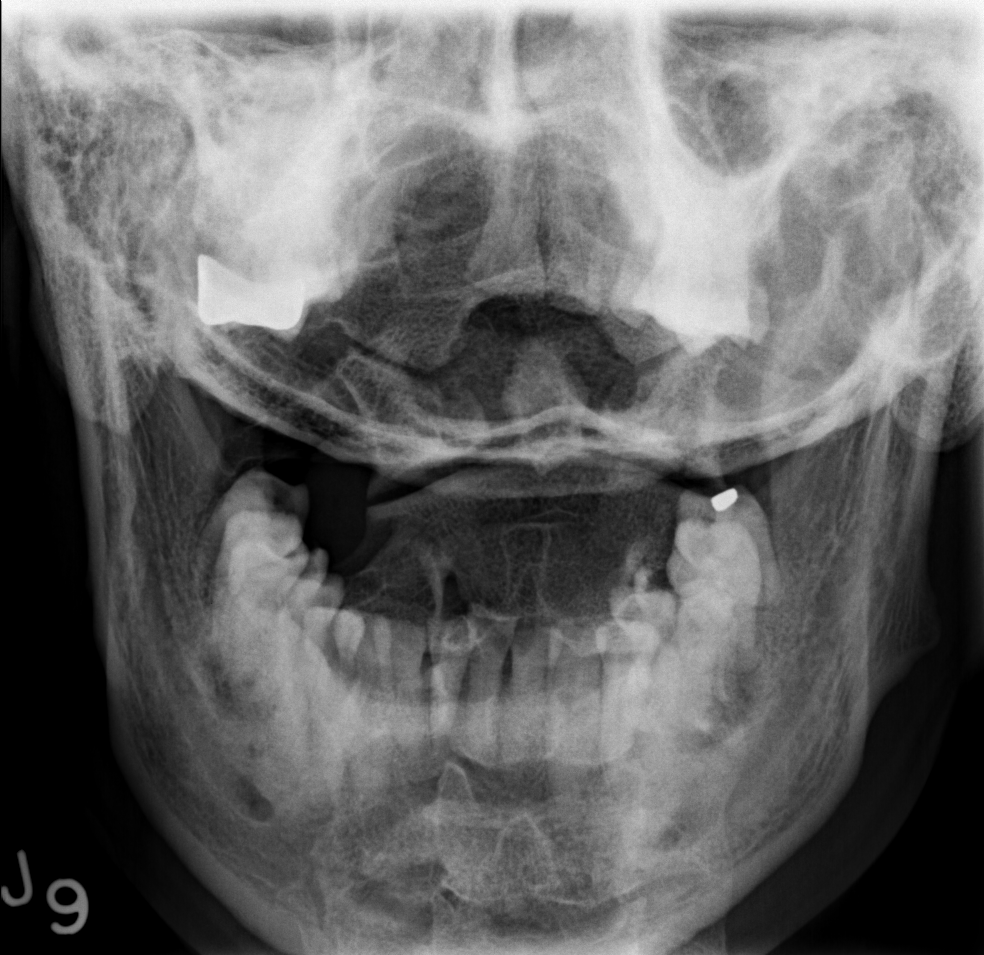

[w cervical spine odontoid (2 of 2)]
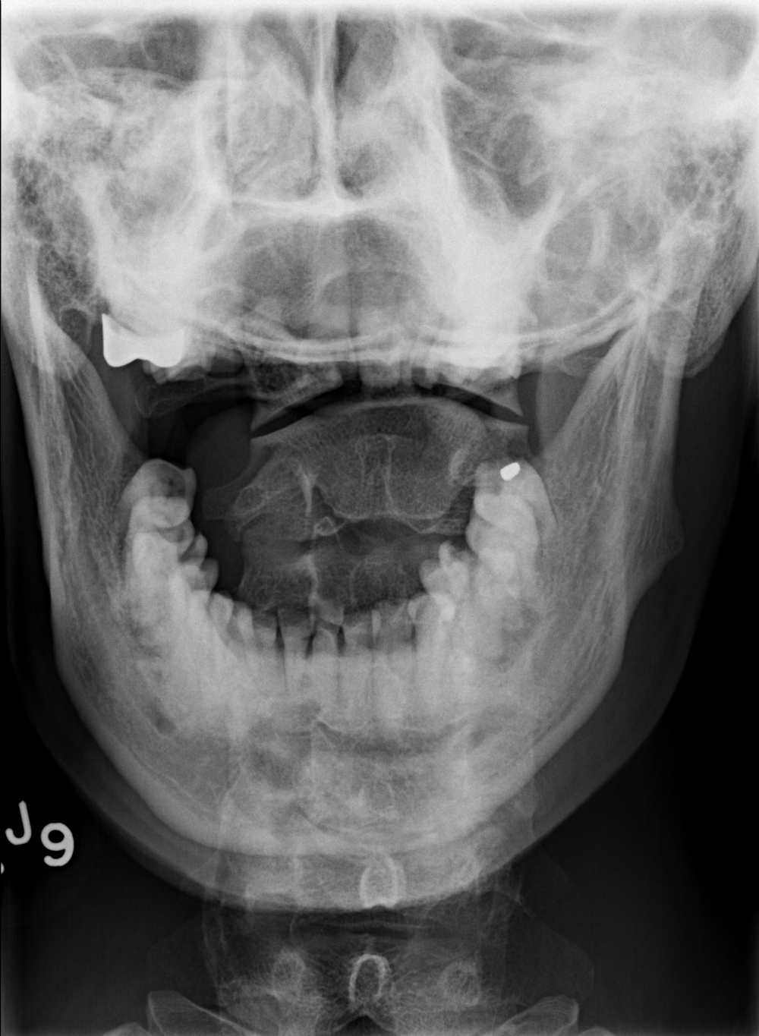

[t cervical spine odontoid]
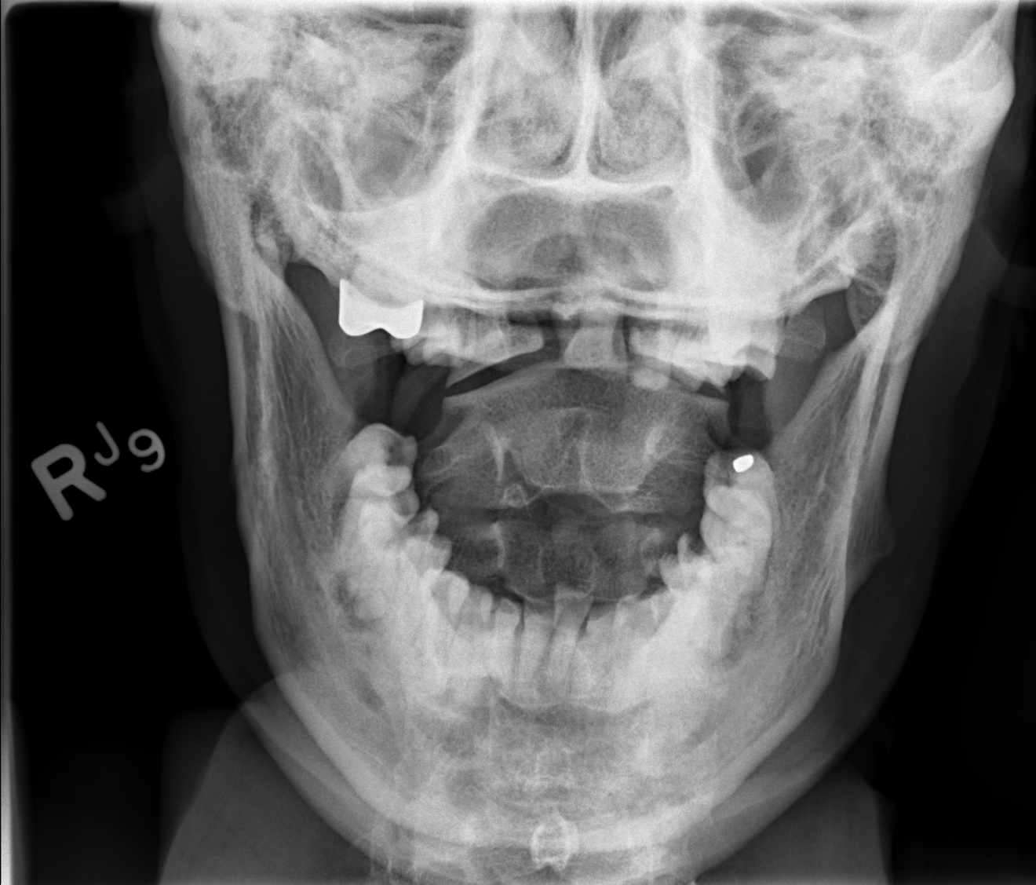

[7 of 7 positions shown; findings below may reference images not displayed]

FINDINGS: Alignment is normal. No soft tissue swelling. There is disc space
narrowing at C5-6 with small posterior osteophytes. There is and
mild foraminal encroachment on the right and moderate foraminal
encroachment on the left at this level. Small anterior osteophytes
are seen at C2-3, C4-5 and C5-6. No evidence of facet arthropathy.
Spinous processes appear normal.
IMPRESSION: Ordinary spondylosis at C5-6 with foraminal encroachment by
osteophytes worse on the left than the right.
